# Patient Record
Sex: Female | Born: 1960 | Race: Black or African American | Hispanic: No | Marital: Married | Smoking: Never smoker
Health system: Southern US, Community
[De-identification: ages and names within clinical notes are randomized; demographics above are authoritative.]

## PROBLEM LIST (undated history)

## (undated) DIAGNOSIS — J309 Allergic rhinitis, unspecified: Secondary | ICD-10-CM

## (undated) DIAGNOSIS — I10 Essential (primary) hypertension: Secondary | ICD-10-CM

## (undated) DIAGNOSIS — R7302 Impaired glucose tolerance (oral): Secondary | ICD-10-CM

## (undated) DIAGNOSIS — E559 Vitamin D deficiency, unspecified: Secondary | ICD-10-CM

## (undated) HISTORY — DX: Vitamin D deficiency, unspecified: E55.9

## (undated) HISTORY — DX: Impaired glucose tolerance (oral): R73.02

## (undated) HISTORY — DX: Allergic rhinitis, unspecified: J30.9

## (undated) HISTORY — PX: TOE SURGERY: SHX1073

---

## 2004-04-15 ENCOUNTER — Emergency Department: Payer: Self-pay | Admitting: Emergency Medicine

## 2005-02-04 ENCOUNTER — Ambulatory Visit: Payer: Self-pay | Admitting: General Practice

## 2005-03-19 ENCOUNTER — Ambulatory Visit: Payer: Self-pay | Admitting: Surgery

## 2006-02-04 ENCOUNTER — Emergency Department: Payer: Self-pay | Admitting: Emergency Medicine

## 2006-09-06 ENCOUNTER — Ambulatory Visit: Payer: Self-pay

## 2010-10-11 ENCOUNTER — Emergency Department: Payer: Self-pay | Admitting: Emergency Medicine

## 2011-02-03 ENCOUNTER — Ambulatory Visit: Payer: Self-pay | Admitting: Internal Medicine

## 2012-02-15 ENCOUNTER — Ambulatory Visit: Payer: Self-pay | Admitting: Internal Medicine

## 2013-10-10 ENCOUNTER — Ambulatory Visit: Payer: Self-pay | Admitting: Internal Medicine

## 2013-12-11 ENCOUNTER — Ambulatory Visit: Payer: Self-pay | Admitting: Internal Medicine

## 2014-10-31 ENCOUNTER — Other Ambulatory Visit: Payer: Self-pay | Admitting: Internal Medicine

## 2014-10-31 DIAGNOSIS — Z1231 Encounter for screening mammogram for malignant neoplasm of breast: Secondary | ICD-10-CM

## 2014-12-16 ENCOUNTER — Ambulatory Visit: Payer: Self-pay | Attending: Internal Medicine

## 2015-07-28 ENCOUNTER — Other Ambulatory Visit: Payer: Self-pay | Admitting: Internal Medicine

## 2015-07-28 DIAGNOSIS — Z1231 Encounter for screening mammogram for malignant neoplasm of breast: Secondary | ICD-10-CM

## 2015-08-07 ENCOUNTER — Ambulatory Visit
Admission: RE | Admit: 2015-08-07 | Discharge: 2015-08-07 | Disposition: A | Discharge status: Home / Self Care | Payer: PRIVATE HEALTH INSURANCE | Source: Ambulatory Visit | Attending: Internal Medicine | Admitting: Internal Medicine

## 2015-08-07 DIAGNOSIS — Z1231 Encounter for screening mammogram for malignant neoplasm of breast: Secondary | ICD-10-CM | POA: Diagnosis present

## 2016-01-12 ENCOUNTER — Encounter: Payer: Self-pay | Admitting: Emergency Medicine

## 2016-01-12 DIAGNOSIS — I10 Essential (primary) hypertension: Secondary | ICD-10-CM | POA: Diagnosis not present

## 2016-01-12 DIAGNOSIS — S3992XA Unspecified injury of lower back, initial encounter: Secondary | ICD-10-CM | POA: Diagnosis present

## 2016-01-12 DIAGNOSIS — S39012A Strain of muscle, fascia and tendon of lower back, initial encounter: Secondary | ICD-10-CM | POA: Insufficient documentation

## 2016-01-12 DIAGNOSIS — Y92410 Unspecified street and highway as the place of occurrence of the external cause: Secondary | ICD-10-CM | POA: Diagnosis not present

## 2016-01-12 DIAGNOSIS — Y9389 Activity, other specified: Secondary | ICD-10-CM | POA: Diagnosis not present

## 2016-01-12 DIAGNOSIS — Y999 Unspecified external cause status: Secondary | ICD-10-CM | POA: Diagnosis not present
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
## 2016-01-13 ENCOUNTER — Emergency Department
Admission: EM | Admit: 2016-01-13 | Discharge: 2016-01-13 | Disposition: A | Discharge status: Home / Self Care | Payer: PRIVATE HEALTH INSURANCE | Attending: Emergency Medicine | Admitting: Emergency Medicine

## 2016-01-13 DIAGNOSIS — S39012A Strain of muscle, fascia and tendon of lower back, initial encounter: Secondary | ICD-10-CM

## 2016-01-13 HISTORY — DX: Essential (primary) hypertension: I10

## 2016-01-13 MED ORDER — DIAZEPAM 2 MG PO TABS
2.0000 mg | ORAL_TABLET | Freq: Once | ORAL | Status: AC
  Administered 2016-01-13: 2 mg via ORAL
  Filled 2016-01-13: qty 1

## 2016-01-13 MED ORDER — IBUPROFEN 600 MG PO TABS: 600.0000 mg | ORAL_TABLET | Freq: Three times a day (TID) | ORAL | 0 refills | Status: DC | PRN

## 2016-01-13 MED ORDER — IBUPROFEN 600 MG PO TABS
600.0000 mg | ORAL_TABLET | Freq: Once | ORAL | Status: AC
  Administered 2016-01-13: 600 mg via ORAL
  Filled 2016-01-13: qty 1

## 2016-01-13 MED ORDER — DIAZEPAM 2 MG PO TABS: 2.0000 mg | ORAL_TABLET | Freq: Three times a day (TID) | ORAL | 0 refills | Status: DC | PRN
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
## 2016-01-13 NOTE — Discharge Instructions (Signed)
1. You may take medicines as needed for pain and muscle spasms (Motrin/Valium #15). 2. Apply moist heat to affected area several times daily. 3. Return to the ER for worsening symptoms, persistent vomiting, difficulty breathing or other concerns.

## 2016-10-11 ENCOUNTER — Other Ambulatory Visit: Payer: Self-pay | Admitting: Internal Medicine

## 2016-10-11 DIAGNOSIS — Z1231 Encounter for screening mammogram for malignant neoplasm of breast: Secondary | ICD-10-CM

## 2016-11-23 ENCOUNTER — Ambulatory Visit
Admission: RE | Admit: 2016-11-23 | Discharge: 2016-11-23 | Disposition: A | Discharge status: Home / Self Care | Payer: PRIVATE HEALTH INSURANCE | Source: Ambulatory Visit | Attending: Internal Medicine | Admitting: Internal Medicine

## 2016-11-23 DIAGNOSIS — Z1231 Encounter for screening mammogram for malignant neoplasm of breast: Secondary | ICD-10-CM | POA: Insufficient documentation

## 2017-10-02 ENCOUNTER — Emergency Department: Payer: PRIVATE HEALTH INSURANCE

## 2017-10-02 ENCOUNTER — Encounter: Payer: Self-pay | Admitting: Emergency Medicine

## 2017-10-02 ENCOUNTER — Other Ambulatory Visit: Payer: Self-pay

## 2017-10-02 ENCOUNTER — Emergency Department
Admission: EM | Admit: 2017-10-02 | Discharge: 2017-10-02 | Disposition: A | Discharge status: Home / Self Care | Payer: PRIVATE HEALTH INSURANCE | Attending: Emergency Medicine | Admitting: Emergency Medicine

## 2017-10-02 DIAGNOSIS — J069 Acute upper respiratory infection, unspecified: Secondary | ICD-10-CM

## 2017-10-02 DIAGNOSIS — I10 Essential (primary) hypertension: Secondary | ICD-10-CM | POA: Insufficient documentation

## 2017-10-02 DIAGNOSIS — H9202 Otalgia, left ear: Secondary | ICD-10-CM | POA: Diagnosis present

## 2017-10-02 LAB — GROUP A STREP BY PCR: Group A Strep by PCR: NOT DETECTED

## 2017-10-02 MED ORDER — PREDNISONE 10 MG PO TABS: ORAL_TABLET | ORAL | 0 refills | Status: DC

## 2017-10-02 MED ORDER — LIDOCAINE VISCOUS 2 % MT SOLN: 10.0000 mL | OROMUCOSAL | 0 refills | Status: DC | PRN

## 2017-10-02 MED ORDER — AZITHROMYCIN 250 MG PO TABS: ORAL_TABLET | ORAL | 0 refills | Status: DC

## 2017-10-02 NOTE — ED Triage Notes (Signed)
Pt c/o cough since Wednesday that is non productive, L ear pain and sore throat with hoarse voice. Pt is alert and oriented at this time.

## 2017-10-02 NOTE — ED Provider Notes (Signed)
[facility] Emergency Department Provider Note  ____________________________________________  Time seen: Approximately 12:26 PM  I have reviewed the triage vital signs and the nursing notes.   HISTORY  Chief Complaint Cough; Sore Throat; and Otalgia    HPI Deborah Raymond is a 57 y.o. female that presents emergency department for evaluation of left ear pain, nasal congestion, sore throat, hoarseness, nonproductive cough for 5 days.  No sick contacts.  Patient is unsure of fever.  She does not smoke. No nausea, vomiting, abdominal pain, diarrhea, constipation.  Past Medical History:  Diagnosis Date  . Hypertension     There are no active problems to display for this patient.   Past Surgical History:  Procedure Laterality Date  . TOE SURGERY      Prior to Admission medications   Medication Sig Start Date End Date Taking? Authorizing Provider  azithromycin (ZITHROMAX Z-PAK) 250 MG tablet Take 2 tablets (500 mg) on  Day 1,  followed by 1 tablet (250 mg) once daily on Days 2 through 5. 10/02/17   Enid Derry, PA-C  diazepam (VALIUM) 2 MG tablet Take 1 tablet (2 mg total) by mouth every 8 (eight) hours as needed for muscle spasms. 01/13/16   Irean Hong, MD  ibuprofen (ADVIL,MOTRIN) 600 MG tablet Take 1 tablet (600 mg total) by mouth every 8 (eight) hours as needed. 01/13/16   Irean Hong, MD  lidocaine (XYLOCAINE) 2 % solution Use as directed 10 mLs in the mouth or throat as needed for mouth pain. 10/02/17   Enid Derry, PA-C  predniSONE (DELTASONE) 10 MG tablet Take 6 tablets on day 1, take 5 tablets on day 2, take 4 tablets on day 3, take 3 tablets on day 4, take 2 tablets on day 5, take 1 tablet on day 6 10/02/17   Enid Derry, PA-C    Allergies Hydrocodone  Family History  Problem Relation Age of Onset  . Breast cancer Cousin 7    Social History Social History   Tobacco Use  . Smoking status: Never Smoker  . Smokeless tobacco: Never  Used  Substance Use Topics  . Alcohol use: No  . Drug use: Not on file     Review of Systems  Constitutional: No chills Eyes: No visual changes. No discharge. ENT: Positive for congestion and rhinorrhea. Cardiovascular: No chest pain. Respiratory: Positive for cough. No SOB. Gastrointestinal: No abdominal pain.  No nausea, no vomiting.  No diarrhea.  No constipation. Musculoskeletal: Negative for musculoskeletal pain. Skin: Negative for rash, abrasions, lacerations, ecchymosis. Neurological: Negative for headaches.   ____________________________________________   PHYSICAL EXAM:  VITAL SIGNS: ED Triage Vitals [10/02/17 1159]  Enc Vitals Group     BP 131/78     Pulse Rate 91     Resp 18     Temp 99.5 F (37.5 C)     Temp Source Oral     SpO2 100 %     Weight 149 lb (67.6 kg)     Height  (1.676 m)     Head Circumference      Peak Flow      Pain Score 0     Pain Loc      Pain Edu?      Excl. in GC?      Constitutional: Alert and oriented. Well appearing and in no acute distress. Eyes: Conjunctivae are normal. PERRL. EOMI. No discharge. Head: Atraumatic. ENT: No frontal and maxillary sinus tenderness.  Ears: Tympanic membranes pearly gray with good landmarks. No discharge.      Nose: Mild congestion/rhinnorhea.      Mouth/Throat: Mucous membranes are moist. Oropharynx mildly erythematous. Tonsils not enlarged. No exudates. Uvula midline. Hoarseness, not hot potato  Neck: No stridor.   Hematological/Lymphatic/Immunilogical: No cervical lymphadenopathy. Cardiovascular: Normal rate, regular rhythm.  Good peripheral circulation. Respiratory: Normal respiratory effort without tachypnea or retractions. Lungs CTAB. Good air entry to the bases with no decreased or absent breath sounds. Gastrointestinal: Bowel sounds 4 quadrants. Soft and nontender to palpation. No guarding or rigidity. No palpable masses. No distention. Musculoskeletal: Full range of motion to  all extremities. No gross deformities appreciated. Neurologic:  Normal speech and language. No gross focal neurologic deficits are appreciated.  Skin:  Skin is warm, dry and intact. No rash noted. Psychiatric: Mood and affect are normal. Speech and behavior are normal. Patient exhibits appropriate insight and judgement.   ____________________________________________   LABS (all labs ordered are listed, but only abnormal results are displayed)  Labs Reviewed  GROUP A STREP BY PCR   ____________________________________________  EKG   ____________________________________________  RADIOLOGY Lexine Baton, personally viewed and evaluated these images (plain radiographs) as part of my medical decision making, as well as reviewing the written report by the radiologist.  Dg Chest 2 View  Result Date: 10/02/2017 CLINICAL DATA:  Cough since Wednesday that is nonproductive. EXAM: CHEST - 2 VIEW COMPARISON:  None. FINDINGS: Normal heart size and mediastinal contours. There is no edema, consolidation, effusion, or pneumothorax. Scoliosis. No acute osseous finding. IMPRESSION: No evidence of active disease. Electronically Signed   By: Marnee Spring M.D.   On: 10/02/2017 13:28    ____________________________________________    PROCEDURES  Procedure(s) performed:    Procedures    Medications - No data to display   ____________________________________________   INITIAL IMPRESSION / ASSESSMENT AND PLAN / ED COURSE  Pertinent labs & imaging results that were available during my care of the patient were reviewed by me and considered in my medical decision making (see chart for details).  Review of the [place] CSRS was performed in accordance of the [facility] prior to dispensing any controlled drugs.     Patient's diagnosis is consistent with URI with cough. Vital signs and exam are reassuring. Strep negative. No infiltrate on chest xray. Patient appears well and is staying well  hydrated. Patient should alternate tylenol and ibuprofen for fever. Patient feels comfortable going home. Patient will be discharged home with prescriptions for azithromycin, prednisone, viscous lidocaine. Patient is to follow up with PCP as needed or otherwise directed. Patient is given ED precautions to return to the ED for any worsening or new symptoms.     ____________________________________________  FINAL CLINICAL IMPRESSION(S) / ED DIAGNOSES  Final diagnoses:  URI with cough and congestion      NEW MEDICATIONS STARTED DURING THIS VISIT:  ED Discharge Orders        Ordered    azithromycin (ZITHROMAX Z-PAK) 250 MG tablet     10/02/17 1339    predniSONE (DELTASONE) 10 MG tablet     10/02/17 1339    lidocaine (XYLOCAINE) 2 % solution  As needed     10/02/17 1339          This chart was dictated using voice recognition software/Dragon. Despite best efforts to proofread, errors can occur which can change the meaning. Any change was purely unintentional.    Enid Derry, PA-C 10/02/17 1535    Cyril Loosen, Molly Maduro,  MD 10/03/17 4098

## 2017-11-23 ENCOUNTER — Other Ambulatory Visit: Payer: Self-pay | Admitting: Internal Medicine

## 2017-11-23 DIAGNOSIS — Z1231 Encounter for screening mammogram for malignant neoplasm of breast: Secondary | ICD-10-CM

## 2017-12-06 ENCOUNTER — Ambulatory Visit
Admission: RE | Admit: 2017-12-06 | Discharge: 2017-12-06 | Disposition: A | Discharge status: Home / Self Care | Payer: PRIVATE HEALTH INSURANCE | Source: Ambulatory Visit | Attending: Internal Medicine | Admitting: Internal Medicine

## 2017-12-06 DIAGNOSIS — Z1231 Encounter for screening mammogram for malignant neoplasm of breast: Secondary | ICD-10-CM | POA: Diagnosis not present

## 2019-09-21 ENCOUNTER — Other Ambulatory Visit: Payer: Self-pay | Admitting: Internal Medicine

## 2019-09-21 DIAGNOSIS — Z1231 Encounter for screening mammogram for malignant neoplasm of breast: Secondary | ICD-10-CM

## 2019-10-02 ENCOUNTER — Ambulatory Visit
Admission: RE | Admit: 2019-10-02 | Discharge: 2019-10-02 | Disposition: A | Discharge status: Home / Self Care | Payer: PRIVATE HEALTH INSURANCE | Source: Ambulatory Visit | Attending: Internal Medicine | Admitting: Internal Medicine

## 2019-10-02 DIAGNOSIS — Z1231 Encounter for screening mammogram for malignant neoplasm of breast: Secondary | ICD-10-CM

## 2019-10-27 ENCOUNTER — Other Ambulatory Visit: Payer: Self-pay

## 2019-10-27 ENCOUNTER — Ambulatory Visit: Payer: PRIVATE HEALTH INSURANCE | Attending: Internal Medicine

## 2019-10-27 DIAGNOSIS — Z23 Encounter for immunization: Secondary | ICD-10-CM

## 2019-10-27 NOTE — Progress Notes (Signed)
   Covid-19 Vaccination Clinic  Name:  Deborah Raymond    MRN: [id number] DOB: [birth year]  10/27/2019  [name] was observed post Covid-19 immunization for 15 minutes without incident. She was provided with Vaccine Information Sheet and instruction to access the V-Safe system.   [name] was instructed to call 911 with any severe reactions post vaccine: Marland Kitchen Difficulty breathing  . Swelling of face and throat  . A fast heartbeat  . A bad rash all over body  . Dizziness and weakness   Immunizations Administered    Name Date Dose VIS Date Route   Pfizer COVID-19 Vaccine 10/27/2019  8:12 AM 0.3 mL 08/01/2018 Intramuscular   Manufacturer: [employer], [employer]   Lot: [id number]   NDC: [address]-4

## 2019-11-17 ENCOUNTER — Ambulatory Visit: Payer: PRIVATE HEALTH INSURANCE | Attending: Internal Medicine

## 2020-12-11 ENCOUNTER — Other Ambulatory Visit: Payer: Self-pay | Admitting: Internal Medicine

## 2020-12-11 DIAGNOSIS — Z1231 Encounter for screening mammogram for malignant neoplasm of breast: Secondary | ICD-10-CM

## 2020-12-31 ENCOUNTER — Other Ambulatory Visit: Payer: Self-pay

## 2020-12-31 ENCOUNTER — Ambulatory Visit
Admission: RE | Admit: 2020-12-31 | Discharge: 2020-12-31 | Disposition: A | Discharge status: Home / Self Care | Payer: PRIVATE HEALTH INSURANCE | Source: Ambulatory Visit | Attending: Internal Medicine | Admitting: Internal Medicine

## 2020-12-31 DIAGNOSIS — Z1231 Encounter for screening mammogram for malignant neoplasm of breast: Secondary | ICD-10-CM

## 2021-12-10 ENCOUNTER — Other Ambulatory Visit: Payer: Self-pay | Admitting: Internal Medicine

## 2021-12-10 DIAGNOSIS — Z1231 Encounter for screening mammogram for malignant neoplasm of breast: Secondary | ICD-10-CM

## 2021-12-21 IMAGING — MG MM DIGITAL SCREENING BILAT W/ TOMO AND CAD
8 series · 8 of 24 positions shown · non-contrast
Comparison: Previous exam(s).

CLINICAL DATA: Screening.

EXAM:
DIGITAL SCREENING BILATERAL MAMMOGRAM WITH TOMOSYNTHESIS AND CAD
TECHNIQUE: Bilateral screening digital craniocaudal and mediolateral oblique
mammograms were obtained. Bilateral screening digital breast
tomosynthesis was performed. The images were evaluated with
computer-aided detection.

[L MLO synth-2D]
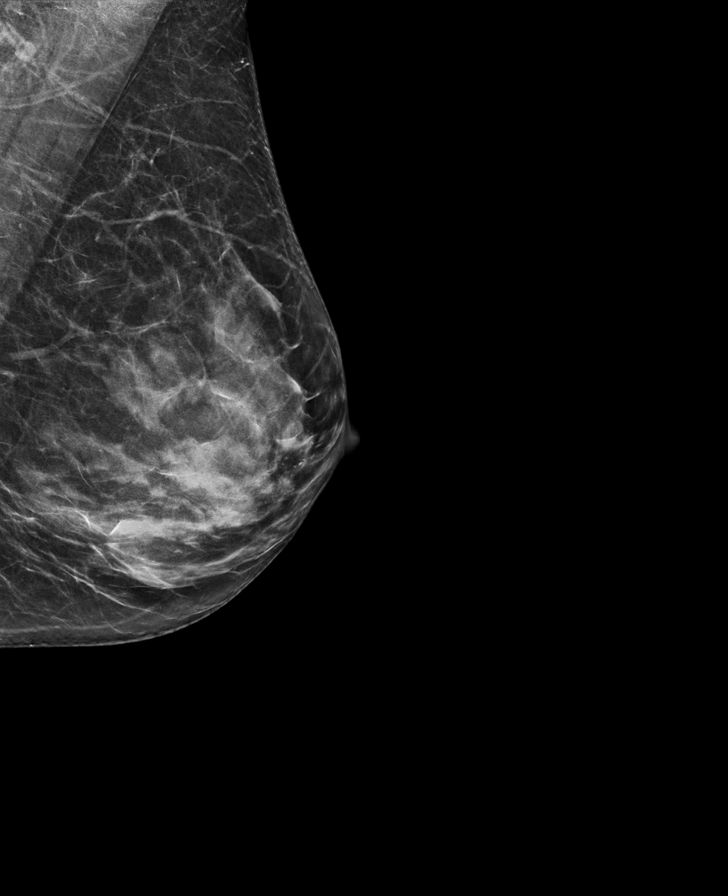

[L CC synth-2D]
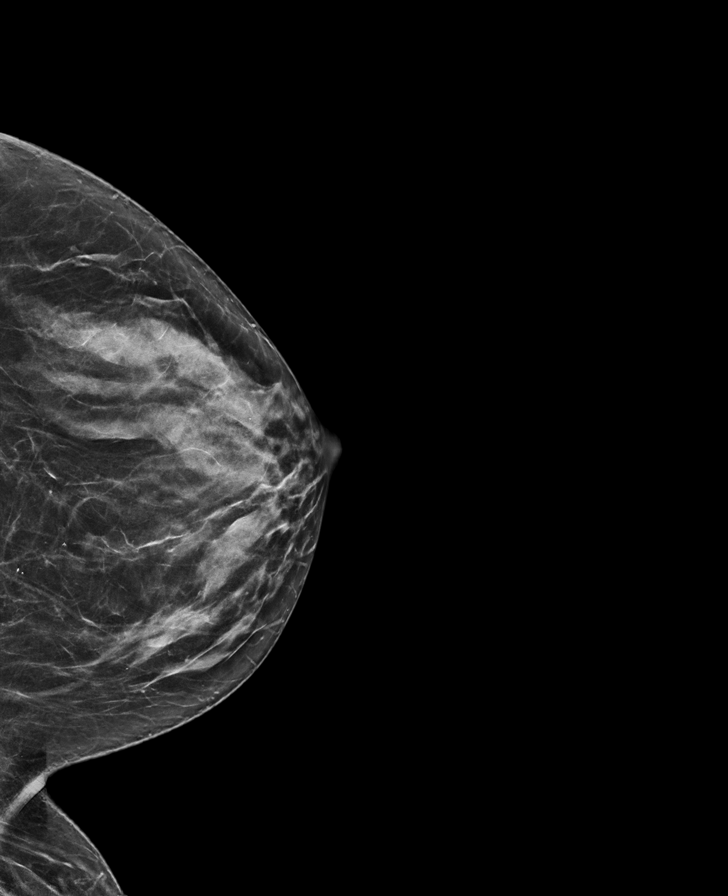

[R CC synth-2D]
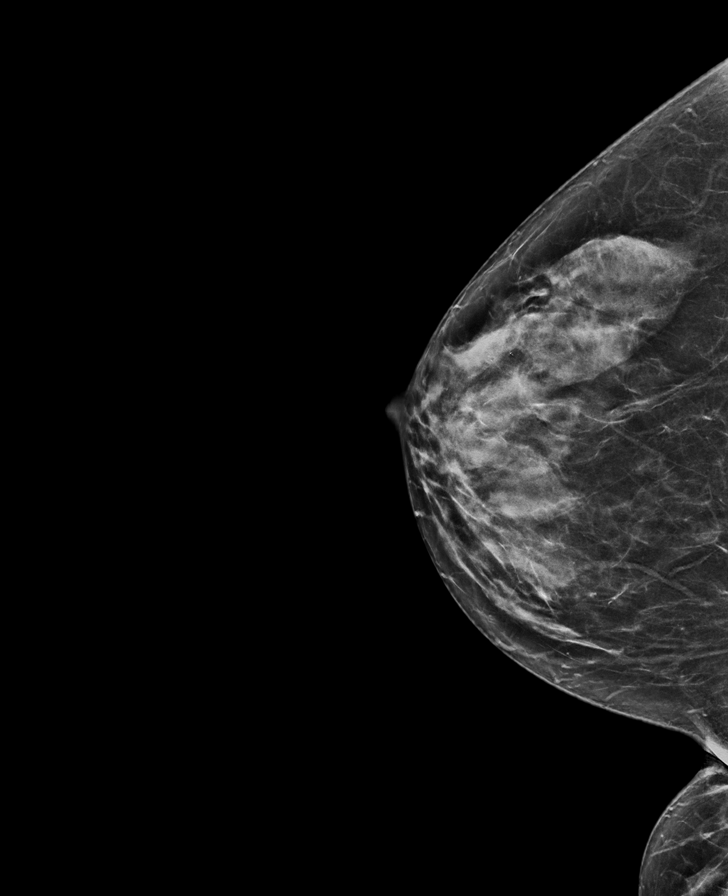

[R MLO synth-2D]
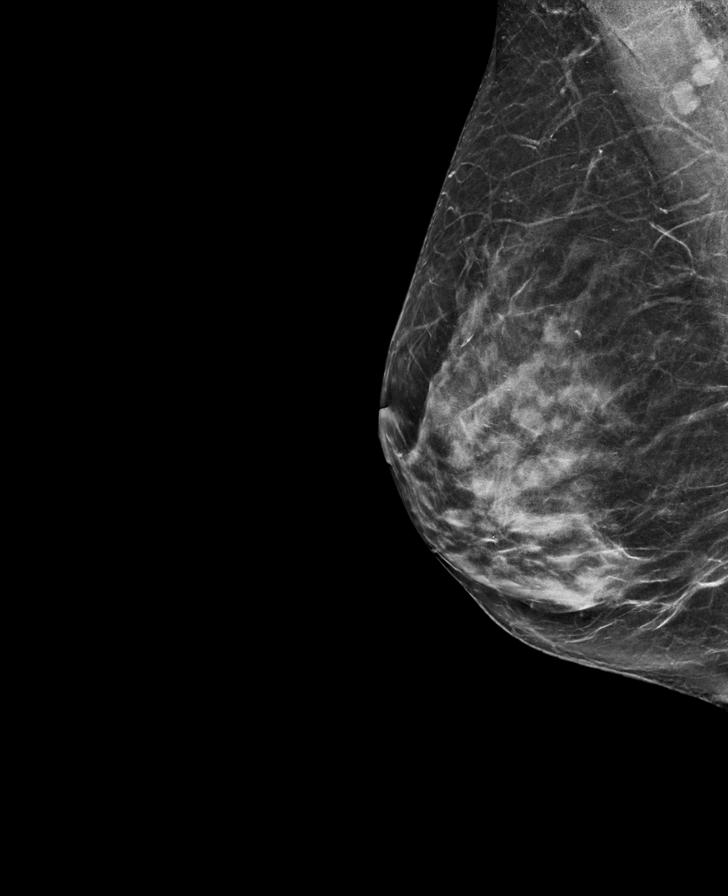

[L CC tomo · tomo slice 33/65.0]
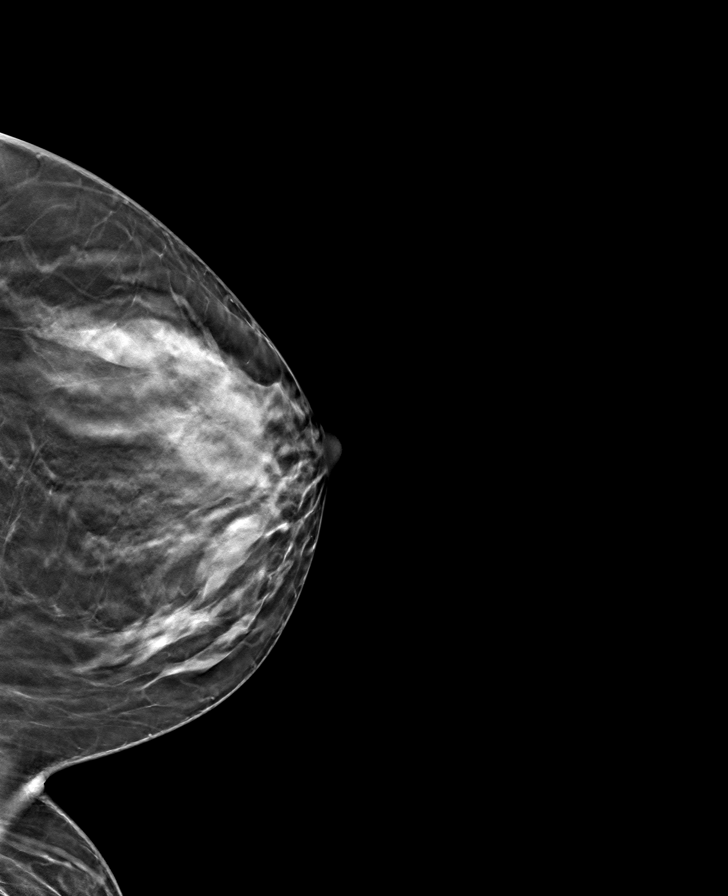

[R MLO tomo · tomo slice 35/69.0]
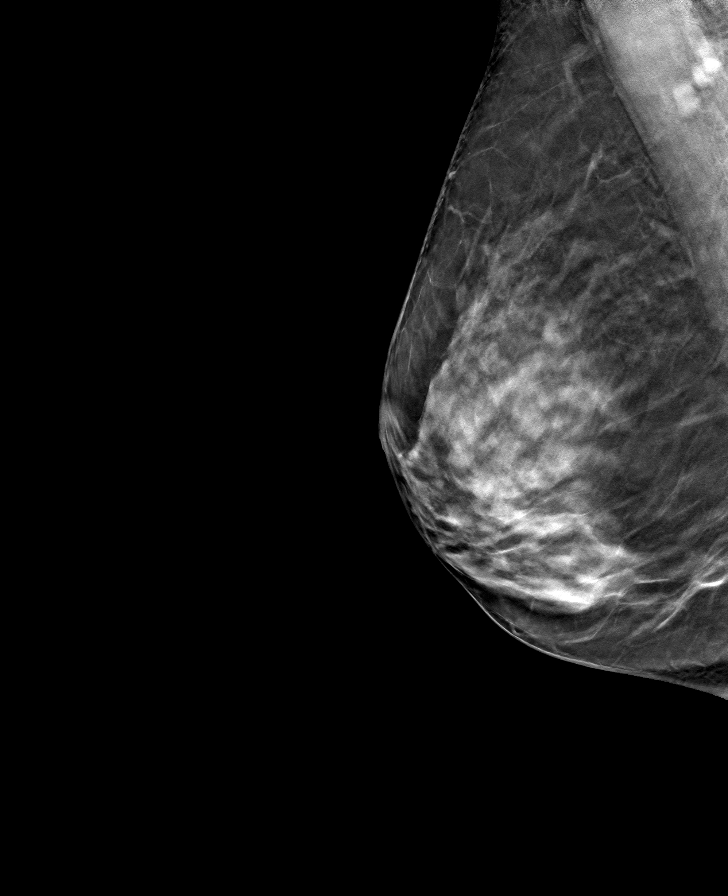

[R CC tomo · tomo slice 35/68.0]
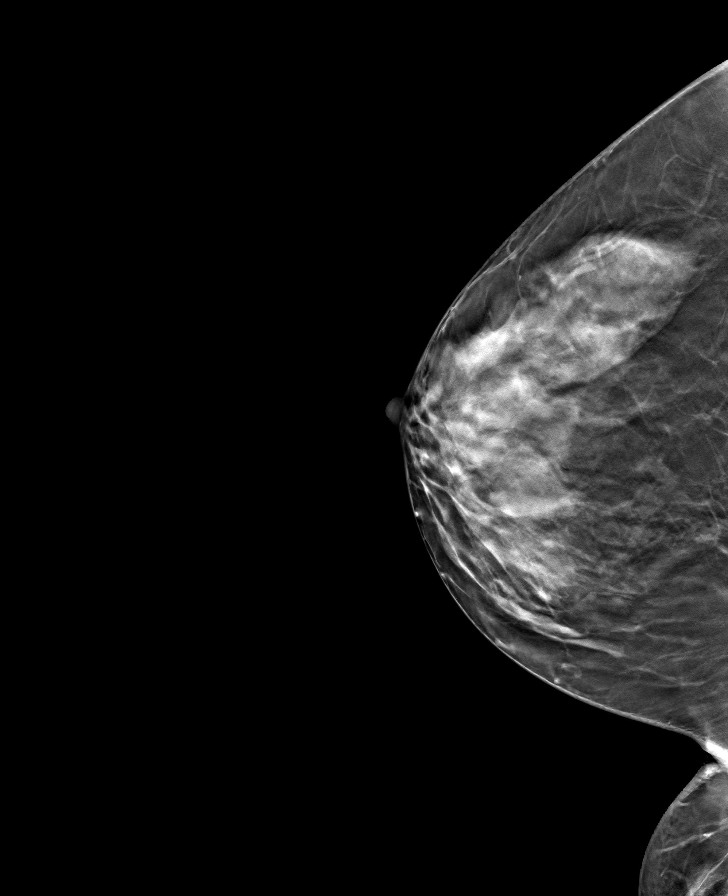

[L MLO tomo · tomo slice 33/65.0]
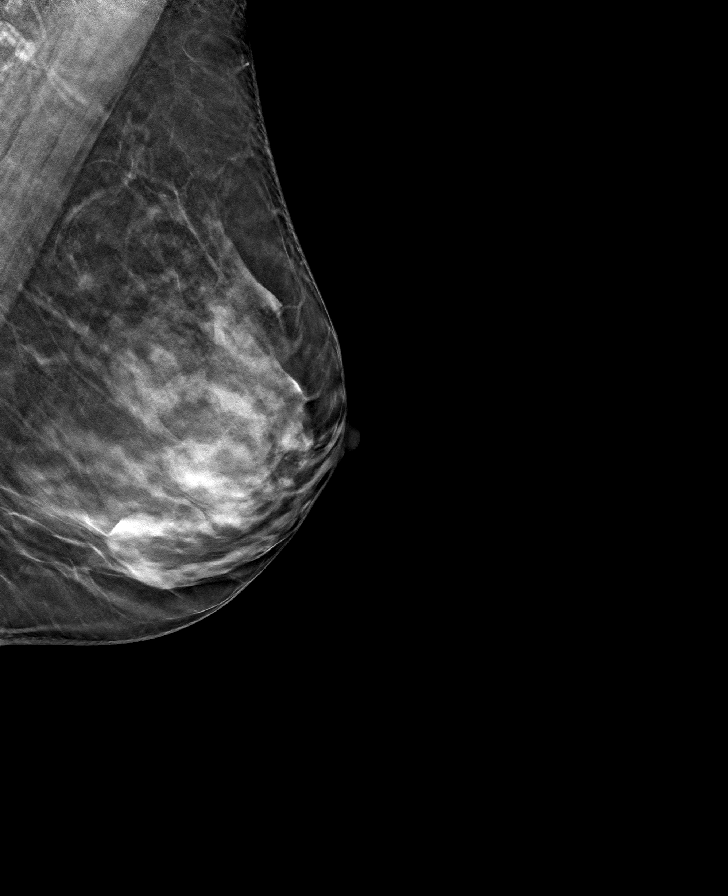

[8 of 24 positions shown; findings below may reference images not displayed]

ACR Breast Density Category c: The breast tissue is heterogeneously
dense, which may obscure small masses.
FINDINGS: There are no findings suspicious for malignancy.
IMPRESSION: No mammographic evidence of malignancy. A result letter of this
screening mammogram will be mailed directly to the patient.

RECOMMENDATION:
Screening mammogram in one year. (Code:Q3-W-BC3)

BI-RADS CATEGORY  1: Negative.

## 2022-01-01 ENCOUNTER — Ambulatory Visit
Admission: RE | Admit: 2022-01-01 | Discharge: 2022-01-01 | Disposition: A | Discharge status: Home / Self Care | Payer: PRIVATE HEALTH INSURANCE | Source: Ambulatory Visit | Attending: Internal Medicine | Admitting: Internal Medicine

## 2022-01-01 DIAGNOSIS — Z1231 Encounter for screening mammogram for malignant neoplasm of breast: Secondary | ICD-10-CM | POA: Diagnosis present

## 2022-05-30 ENCOUNTER — Encounter: Payer: Self-pay | Admitting: Emergency Medicine

## 2022-05-30 ENCOUNTER — Emergency Department
Admission: EM | Admit: 2022-05-30 | Discharge: 2022-05-30 | Disposition: A | Discharge status: Home / Self Care | Payer: PRIVATE HEALTH INSURANCE | Attending: Emergency Medicine | Admitting: Emergency Medicine

## 2022-05-30 ENCOUNTER — Other Ambulatory Visit: Payer: Self-pay

## 2022-05-30 DIAGNOSIS — Z1152 Encounter for screening for COVID-19: Secondary | ICD-10-CM | POA: Diagnosis not present

## 2022-05-30 DIAGNOSIS — J111 Influenza due to unidentified influenza virus with other respiratory manifestations: Secondary | ICD-10-CM

## 2022-05-30 DIAGNOSIS — R509 Fever, unspecified: Secondary | ICD-10-CM | POA: Diagnosis not present

## 2022-05-30 LAB — RESP PANEL BY RT-PCR (RSV, FLU A&B, COVID)  RVPGX2
Influenza A by PCR: NEGATIVE
Influenza B by PCR: NEGATIVE
Resp Syncytial Virus by PCR: NEGATIVE
SARS Coronavirus 2 by RT PCR: NEGATIVE

## 2022-05-30 MED ORDER — OSELTAMIVIR PHOSPHATE 75 MG PO CAPS: 75.0000 mg | ORAL_CAPSULE | Freq: Two times a day (BID) | ORAL | 0 refills | Status: AC

## 2022-05-30 MED ORDER — BENZONATATE 100 MG PO CAPS: 100.0000 mg | ORAL_CAPSULE | Freq: Three times a day (TID) | ORAL | 0 refills | Status: DC | PRN

## 2022-05-30 NOTE — ED Triage Notes (Signed)
Pt vis POV c/o fever, chills, dry non-productive cough ongoing for 1 day. No known sick exposure. Pt has take OTC tylenol around 5 pm today. Reports fevers 102 F. Denies NVD. No CP/SOB.

## 2022-05-30 NOTE — ED Provider Notes (Signed)
[facility] Provider Note    Event Date/Time   First MD Initiated Contact with Patient 05/30/22 2052     (approximate)   History   Fever   HPI  Deborah Raymond is a 61 y.o. female presents emergency department with fever, chills, cough for 1 day.  Taking over-the-counter Tylenol for fever.  States was 102 earlier.  No chest pain or shortness of breath.  No vomiting or diarrhea      Physical Exam   Triage Vital Signs: ED Triage Vitals [05/30/22 1928]  Enc Vitals Group     BP 126/61     Pulse Rate 99     Resp 15     Temp 98.2 F (36.8 C)     Temp Source Oral     SpO2 100 %     Weight 160 lb (72.6 kg)     Height 5\' 6"  (1.676 m)     Head Circumference      Peak Flow      Pain Score 0     Pain Loc      Pain Edu?      Excl. in GC?     Most recent vital signs: Vitals:   05/30/22 1928  BP: 126/61  Pulse: 99  Resp: 15  Temp: 98.2 F (36.8 C)  SpO2: 100%     General: Awake, no distress.   CV:  Good peripheral perfusion. regular rate and  rhythm Resp:  Normal effort. Lungs cta Abd:  No distention.   Other:      ED Results / Procedures / Treatments   Labs (all labs ordered are listed, but only abnormal results are displayed) Labs Reviewed  RESP PANEL BY RT-PCR (RSV, FLU A&B, COVID)  [id number]     EKG     RADIOLOGY     PROCEDURES:   Procedures   MEDICATIONS ORDERED IN ED: Medications - No data to display   IMPRESSION / MDM / ASSESSMENT AND PLAN / ED COURSE  I reviewed the triage vital signs and the nursing notes.                              Differential diagnosis includes, but is not limited to, COVID, influenza, RSV, CAP  Patient's presentation is most consistent with acute complicated illness / injury requiring diagnostic workup.   The patient appears to be very well, vitals are normal, I do not feel she is warranting further workup as her respiratory panel is negative.  Did explain to her that a lot  of times flu will not show up on swab until 1 to 2 days of illness.  Will go ahead and start her on Tamiflu as she has flulike symptoms.  She is in agreement with this treatment plan.  She was also given Tessalon Perles for her cough.  Discharged in stable condition.      FINAL CLINICAL IMPRESSION(S) / ED DIAGNOSES   Final diagnoses:  Influenza-like illness     Rx / DC Orders   ED Discharge Orders          Ordered    oseltamivir (TAMIFLU) 75 MG capsule  2 times daily        05/30/22 2053    benzonatate (TESSALON PERLES) 100 MG capsule  3 times daily PRN        05/30/22 2053  Note:  This document was prepared using Dragon voice recognition software and may include unintentional dictation errors.    Faythe Ghee, PA-C 05/30/22 2145    Minna Antis, MD 05/30/22 2203

## 2022-09-02 ENCOUNTER — Ambulatory Visit: Payer: PRIVATE HEALTH INSURANCE | Admitting: Internal Medicine

## 2022-10-14 ENCOUNTER — Ambulatory Visit: Payer: PRIVATE HEALTH INSURANCE | Admitting: Internal Medicine

## 2022-10-14 ENCOUNTER — Encounter: Payer: Self-pay | Admitting: Internal Medicine

## 2022-10-14 VITALS — BP 120/72 | HR 75 | Ht 62.0 in | Wt 163.8 lb

## 2022-10-14 DIAGNOSIS — R7302 Impaired glucose tolerance (oral): Secondary | ICD-10-CM

## 2022-10-14 DIAGNOSIS — I10 Essential (primary) hypertension: Secondary | ICD-10-CM | POA: Diagnosis not present

## 2022-10-14 DIAGNOSIS — E559 Vitamin D deficiency, unspecified: Secondary | ICD-10-CM

## 2022-10-14 DIAGNOSIS — J060 Acute laryngopharyngitis: Secondary | ICD-10-CM

## 2022-10-14 DIAGNOSIS — J301 Allergic rhinitis due to pollen: Secondary | ICD-10-CM

## 2022-10-14 MED ORDER — DOXYCYCLINE HYCLATE 100 MG PO TABS: 100.0000 mg | ORAL_TABLET | Freq: Two times a day (BID) | ORAL | 0 refills | Status: DC

## 2022-10-14 MED ORDER — FLUTICASONE PROPIONATE 50 MCG/ACT NA SUSP: 1.0000 | Freq: Every day | NASAL | 2 refills | Status: DC

## 2022-10-14 NOTE — Progress Notes (Signed)
Established Patient Office Visit  Subjective:  Patient ID: Deborah Raymond, female    DOB: 14-Apr-1961  Age: 62 y.o. MRN: [id number]  Chief Complaint  Patient presents with   Follow-up    Ear ache/sore throat    Patient comes in for an acute visit today with complaints of right ear pain and some sore throat.  She says she started 3 days ago thinking it was her nasal allergies which she usually gets seasonally.  Now she has some congestion of her head, postnasal drip, mild cough and mostly it is the right ear pain and pressure.  She does not have any fevers or chills, no nausea or vomiting, no dizziness.  She has not been around anybody who has been feeling unwell.  No body aches.  There is no diarrhea or constipation. On exam her throat is only mildly irritated, but her right tympanic membrane is opaque with some redness in the center.    No other concerns at this time.   Past Medical History:  Diagnosis Date   Allergic rhinitis    Hypertension    Impaired glucose tolerance    Vitamin D deficiency     Past Surgical History:  Procedure Laterality Date   TOE SURGERY      Social History   Socioeconomic History   Marital status: Married    Spouse name: Not on file   Number of children: Not on file   Years of education: Not on file   Highest education level: Not on file  Occupational History   Not on file  Tobacco Use   Smoking status: Never   Smokeless tobacco: Never  Vaping Use   Vaping Use: Never used  Substance and Sexual Activity   Alcohol use: No   Drug use: Not on file   Sexual activity: Not on file  Other Topics Concern   Not on file  Social History Narrative   Not on file   Social Determinants of Health   Financial Resource Strain: Not on file  Food Insecurity: Not on file  Transportation Needs: Not on file  Physical Activity: Not on file  Stress: Not on file  Social Connections: Not on file  Intimate Partner Violence: Not on file    Family  History  Problem Relation Age of Onset   Breast cancer Cousin 37       maternal    Allergies  Allergen Reactions   Hydrocodone    Penicillins Hives    Review of Systems  Constitutional:  Positive for malaise/fatigue. Negative for chills, diaphoresis, fever and weight loss.  HENT:  Positive for congestion and ear pain. Negative for ear discharge, sinus pain, sore throat and tinnitus.   Eyes:  Negative for blurred vision, double vision, photophobia, pain, discharge and redness.  Respiratory:  Positive for cough. Negative for shortness of breath, wheezing and stridor.   Cardiovascular:  Negative for chest pain, palpitations, leg swelling and PND.  Gastrointestinal:  Negative for abdominal pain, blood in stool, constipation, diarrhea, heartburn, melena, nausea and vomiting.  Genitourinary:  Negative for dysuria, frequency, hematuria and urgency.  Musculoskeletal:  Negative for back pain, falls, joint pain and myalgias.  Neurological:  Negative for dizziness, tingling, sensory change, weakness and headaches.  Psychiatric/Behavioral:  Negative for depression. The patient is not nervous/anxious and does not have insomnia.        Objective:   BP 120/72   Pulse 75   Ht 5\' 2"  (1.575 m)   Wt 163  lb 12.8 oz (74.3 kg)   SpO2 97%   BMI 29.96 kg/m   Vitals:   10/14/22 0930  BP: 120/72  Pulse: 75  Height: 5\' 2"  (1.575 m)  Weight: 163 lb 12.8 oz (74.3 kg)  SpO2: 97%  BMI (Calculated): 29.95    Physical Exam Vitals and nursing note reviewed.  Constitutional:      Appearance: Normal appearance.  HENT:     Head: Normocephalic.  Cardiovascular:     Rate and Rhythm: Normal rate and regular rhythm.     Pulses: Normal pulses.     Heart sounds: Normal heart sounds.  Pulmonary:     Effort: Pulmonary effort is normal.     Breath sounds: Normal breath sounds.  Abdominal:     General: Abdomen is flat.     Palpations: Abdomen is soft. There is no mass.     Tenderness: There is no  guarding.  Musculoskeletal:        General: Normal range of motion.     Cervical back: Normal range of motion and neck supple.  Skin:    General: Skin is warm.  Neurological:     General: No focal deficit present.     Mental Status: She is alert and oriented to person, place, and time.  Psychiatric:        Mood and Affect: Mood normal.        Behavior: Behavior normal.      No results found for any visits on 10/14/22.  No results found for this or any previous visit (from the past 2160 hour(s)).    Assessment & Plan:  Send in prescription for p.o. doxycycline.  Patient will use nasal spray and antihistamine Zyrtec.  Rest fluids and Tylenol as needed Problem List Items Addressed This Visit     Acute laryngopharyngitis - Primary   Relevant Medications   doxycycline (VIBRA-TABS) 100 MG tablet   Essential hypertension, benign   Relevant Medications   amLODipine-olmesartan (AZOR) 5-20 MG tablet   Impaired glucose tolerance   Vitamin D deficiency   Seasonal allergic rhinitis due to pollen    Return in about 1 week (around 10/21/2022).   Total time spent: 25 minutes  Margaretann Loveless, MD  10/14/2022

## 2022-10-22 ENCOUNTER — Ambulatory Visit: Payer: PRIVATE HEALTH INSURANCE | Admitting: Internal Medicine

## 2022-11-05 ENCOUNTER — Other Ambulatory Visit: Payer: Self-pay | Admitting: Internal Medicine

## 2022-11-05 DIAGNOSIS — J301 Allergic rhinitis due to pollen: Secondary | ICD-10-CM

## 2023-02-22 ENCOUNTER — Other Ambulatory Visit: Payer: Self-pay | Admitting: Internal Medicine

## 2023-02-22 DIAGNOSIS — Z1231 Encounter for screening mammogram for malignant neoplasm of breast: Secondary | ICD-10-CM

## 2023-03-08 ENCOUNTER — Ambulatory Visit
Admission: RE | Admit: 2023-03-08 | Discharge: 2023-03-08 | Disposition: A | Discharge status: Home / Self Care | Payer: PRIVATE HEALTH INSURANCE | Source: Ambulatory Visit | Attending: Internal Medicine | Admitting: Internal Medicine

## 2023-03-08 DIAGNOSIS — Z1231 Encounter for screening mammogram for malignant neoplasm of breast: Secondary | ICD-10-CM | POA: Diagnosis present

## 2023-05-13 ENCOUNTER — Other Ambulatory Visit: Payer: Self-pay | Admitting: Internal Medicine

## 2023-05-13 DIAGNOSIS — I10 Essential (primary) hypertension: Secondary | ICD-10-CM

## 2023-08-12 ENCOUNTER — Ambulatory Visit: Payer: No Typology Code available for payment source | Admitting: Internal Medicine

## 2023-08-12 ENCOUNTER — Encounter: Payer: Self-pay | Admitting: Internal Medicine

## 2023-08-12 VITALS — BP 112/72 | HR 88 | Ht 62.0 in | Wt 167.2 lb

## 2023-08-12 DIAGNOSIS — E559 Vitamin D deficiency, unspecified: Secondary | ICD-10-CM

## 2023-08-12 DIAGNOSIS — R5383 Other fatigue: Secondary | ICD-10-CM | POA: Diagnosis not present

## 2023-08-12 DIAGNOSIS — R7303 Prediabetes: Secondary | ICD-10-CM | POA: Diagnosis not present

## 2023-08-12 DIAGNOSIS — I10 Essential (primary) hypertension: Secondary | ICD-10-CM

## 2023-08-12 DIAGNOSIS — M79604 Pain in right leg: Secondary | ICD-10-CM

## 2023-08-12 DIAGNOSIS — R7302 Impaired glucose tolerance (oral): Secondary | ICD-10-CM

## 2023-08-12 MED ORDER — GABAPENTIN 100 MG PO CAPS: 100.0000 mg | ORAL_CAPSULE | Freq: Every day | ORAL | 2 refills | Status: DC

## 2023-08-12 NOTE — Progress Notes (Signed)
 Established Patient Office Visit  Subjective:  Patient ID: Deborah Raymond, female    DOB: 1961/03/27  Age: 63 y.o. MRN: [id number]  Chief Complaint  Patient presents with   Leg Pain    Constant right leg pain    Patient not in since May 2024.  Today comes in with complaints of right leg pain which started 4 months ago.  Patient reports is not in excruciating pain but a 4 on a scale of 1-10.  She takes a Tylenol and the discomfort goes away.  She cannot identify if it is her hip or knee or right ankle but somehow the whole leg hurts.  Now it has shifted mostly to the right lower leg along with tenderness of her right calf.  But there is no swelling or skin changes.  She has to stand at work on a concrete floor.  Recently changed her shoes with good arch support but the pain continues.  She describes an element of of restless leg, but sleeps well at night.  There is no back pain. Will check venous Doppler of the right lower leg to exclude possibility of DVT or phlebitis. Check labs. Start gabapentin at bedtime.  By  No other concerns at this time.   Past Medical History:  Diagnosis Date   Allergic rhinitis    Hypertension    Impaired glucose tolerance    Vitamin D deficiency     Past Surgical History:  Procedure Laterality Date   TOE SURGERY      Social History   Socioeconomic History   Marital status: Married    Spouse name: Not on file   Number of children: Not on file   Years of education: Not on file   Highest education level: Not on file  Occupational History   Not on file  Tobacco Use   Smoking status: Never   Smokeless tobacco: Never  Vaping Use   Vaping status: Never Used  Substance and Sexual Activity   Alcohol use: No   Drug use: Not on file   Sexual activity: Not on file  Other Topics Concern   Not on file  Social History Narrative   Not on file   Social Drivers of Health   Financial Resource Strain: Not on file  Food Insecurity: Not on file   Transportation Needs: Not on file  Physical Activity: Not on file  Stress: Not on file  Social Connections: Unknown (10/20/2021)   Received from [facility]   Social Network    Social Network: Not on file  Intimate Partner Violence: Unknown (09/10/2021)   Received from [facility]   HITS    Physically Hurt: Not on file    Insult or Talk Down To: Not on file    Threaten Physical Harm: Not on file    Scream or Curse: Not on file    Family History  Problem Relation Age of Onset   Breast cancer Cousin 47       maternal    Allergies  Allergen Reactions   Hydrocodone    Penicillins Hives    Outpatient Medications Prior to Visit  Medication Sig   amLODipine-olmesartan (AZOR) 5-20 MG tablet TAKE 1 TABLET BY MOUTH EVERY DAY   fluticasone (FLONASE) 50 MCG/ACT nasal spray SPRAY 1 SPRAY INTO BOTH NOSTRILS DAILY.   Vitamin D, Ergocalciferol, (DRISDOL) 1.25 MG (50000 UNIT) CAPS capsule Take 50,000 Units by mouth once a week.   doxycycline (VIBRA-TABS) 100 MG  tablet Take 1 tablet (100 mg total) by mouth 2 (two) times daily. (Patient not taking: Reported on 08/12/2023)   No facility-administered medications prior to visit.    Review of Systems  Constitutional:  Positive for malaise/fatigue. Negative for chills, fever and weight loss.  HENT: Negative.  Negative for sore throat.   Eyes: Negative.   Respiratory: Negative.  Negative for cough and shortness of breath.   Cardiovascular: Negative.  Negative for chest pain, palpitations and leg swelling.  Gastrointestinal: Negative.  Negative for abdominal pain, constipation, diarrhea, heartburn, nausea and vomiting.  Genitourinary: Negative.  Negative for dysuria and flank pain.  Musculoskeletal:  Positive for joint pain and myalgias.  Skin: Negative.   Neurological: Negative.  Negative for dizziness, tingling, tremors, sensory change, speech change and headaches.  Endo/Heme/Allergies: Negative.    Psychiatric/Behavioral: Negative.  Negative for depression and suicidal ideas. The patient is not nervous/anxious.        Objective:   BP 112/72   Pulse 88   Ht 5\' 2"  (1.575 m)   Wt 167 lb 3.2 oz (75.8 kg)   SpO2 97%   BMI 30.58 kg/m   Vitals:   08/12/23 1524  BP: 112/72  Pulse: 88  Height: 5\' 2"  (1.575 m)  Weight: 167 lb 3.2 oz (75.8 kg)  SpO2: 97%  BMI (Calculated): 30.57    Physical Exam Vitals and nursing note reviewed.  Constitutional:      Appearance: Normal appearance.  HENT:     Head: Normocephalic and atraumatic.     Nose: Nose normal.     Mouth/Throat:     Mouth: Mucous membranes are moist.     Pharynx: Oropharynx is clear.  Eyes:     Conjunctiva/sclera: Conjunctivae normal.     Pupils: Pupils are equal, round, and reactive to light.  Cardiovascular:     Rate and Rhythm: Normal rate and regular rhythm.     Pulses: Normal pulses.     Heart sounds: Normal heart sounds. No murmur heard. Pulmonary:     Effort: Pulmonary effort is normal.     Breath sounds: Normal breath sounds. No wheezing.  Abdominal:     General: Bowel sounds are normal.     Palpations: Abdomen is soft.     Tenderness: There is no abdominal tenderness. There is no right CVA tenderness or left CVA tenderness.  Musculoskeletal:        General: Normal range of motion.     Cervical back: Normal range of motion.     Right lower leg: No edema.     Left lower leg: No edema.  Skin:    General: Skin is warm and dry.  Neurological:     General: No focal deficit present.     Mental Status: She is alert and oriented to person, place, and time.  Psychiatric:        Mood and Affect: Mood normal.        Behavior: Behavior normal.      No results found for any visits on 08/12/23.  No results found for this or any previous visit (from the past 2160 hours).    Assessment & Plan:  Check labs, venous Dopplers. Start gabapentin at bedtime. Problem List Items Addressed This Visit      Essential hypertension, benign   Relevant Orders   CMP14+EGFR   CBC with Diff   Impaired glucose tolerance   Vitamin D deficiency   Relevant Orders   Vitamin D (25 hydroxy)   Other  Visit Diagnoses       Pain of right lower extremity    -  Primary   Relevant Medications   gabapentin (NEURONTIN) 100 MG capsule   Other Relevant Orders   Arthritis Panel   VAS [place] LOWER EXTREMITY VENOUS (DVT)     Other fatigue       Relevant Orders   Ferritin       Return in about 10 days (around 08/22/2023).   Total time spent: 30 minutes  Margaretann Loveless, MD  08/12/2023   This document may have been prepared by [facility] Voice Recognition software and as such may include unintentional dictation errors.

## 2023-08-13 LAB — CMP14+EGFR
ALT: 7 IU/L (ref 0–32)
AST: 15 IU/L (ref 0–40)
Albumin: 4.3 g/dL (ref 3.9–4.9)
Alkaline Phosphatase: 86 IU/L (ref 44–121)
BUN/Creatinine Ratio: 17 (ref 12–28)
BUN: 12 mg/dL (ref 8–27)
Bilirubin Total: 0.6 mg/dL (ref 0.0–1.2)
CO2: 26 mmol/L (ref 20–29)
Calcium: 9.5 mg/dL (ref 8.7–10.3)
Chloride: 102 mmol/L (ref 96–106)
Creatinine, Ser: 0.71 mg/dL (ref 0.57–1.00)
Globulin, Total: 2.8 g/dL (ref 1.5–4.5)
Glucose: 93 mg/dL (ref 70–99)
Potassium: 3.8 mmol/L (ref 3.5–5.2)
Sodium: 138 mmol/L (ref 134–144)
Total Protein: 7.1 g/dL (ref 6.0–8.5)
eGFR: 97 mL/min/{1.73_m2} (ref >59)

## 2023-08-13 LAB — CBC WITH DIFFERENTIAL/PLATELET
Basophils Absolute: 0 10*3/uL (ref 0.0–0.2)
Basos: 1 %
EOS (ABSOLUTE): 0.1 10*3/uL (ref 0.0–0.4)
Eos: 1 %
Hematocrit: 36.7 % (ref 34.0–46.6)
Hemoglobin: 12.1 g/dL (ref 11.1–15.9)
Immature Grans (Abs): 0 10*3/uL (ref 0.0–0.1)
Immature Granulocytes: 0 %
Lymphocytes Absolute: 2.2 10*3/uL (ref 0.7–3.1)
Lymphs: 49 %
MCH: 30.5 pg (ref 26.6–33.0)
MCHC: 33 g/dL (ref 31.5–35.7)
MCV: 92 fL (ref 79–97)
Monocytes Absolute: 0.3 10*3/uL (ref 0.1–0.9)
Monocytes: 7 %
Neutrophils Absolute: 1.9 10*3/uL (ref 1.4–7.0)
Neutrophils: 42 %
Platelets: 203 10*3/uL (ref 150–450)
RBC: 3.97 x10E6/uL (ref 3.77–5.28)
RDW: 12.3 % (ref 11.7–15.4)
WBC: 4.6 10*3/uL (ref 3.4–10.8)

## 2023-08-13 LAB — ARTHRITIS PANEL
Anti Nuclear Antibody (ANA): NEGATIVE
Rheumatoid fact SerPl-aCnc: <10 [IU]/mL (ref <14.0)
Sed Rate: 31 mm/h (ref 0–40)
Uric Acid: 3.8 mg/dL (ref 3.0–7.2)

## 2023-08-13 LAB — FERRITIN: Ferritin: 121 ng/mL (ref 15–150)

## 2023-08-13 LAB — VITAMIN D 25 HYDROXY (VIT D DEFICIENCY, FRACTURES): Vit D, 25-Hydroxy: 29.9 ng/mL — ABNORMAL LOW (ref 30.0–100.0)

## 2023-08-23 ENCOUNTER — Ambulatory Visit: Admitting: Internal Medicine

## 2023-10-05 ENCOUNTER — Encounter (INDEPENDENT_AMBULATORY_CARE_PROVIDER_SITE_OTHER): Payer: Self-pay | Admitting: Nurse Practitioner

## 2023-10-17 ENCOUNTER — Ambulatory Visit (INDEPENDENT_AMBULATORY_CARE_PROVIDER_SITE_OTHER): Payer: Self-pay

## 2023-10-17 DIAGNOSIS — M79604 Pain in right leg: Secondary | ICD-10-CM

## 2023-10-19 ENCOUNTER — Ambulatory Visit: Payer: Self-pay | Admitting: Family

## 2023-10-21 NOTE — Progress Notes (Signed)
 Patient notified

## 2023-10-25 ENCOUNTER — Encounter (INDEPENDENT_AMBULATORY_CARE_PROVIDER_SITE_OTHER): Payer: Self-pay

## 2023-11-05 ENCOUNTER — Other Ambulatory Visit: Payer: Self-pay | Admitting: Internal Medicine

## 2023-11-05 DIAGNOSIS — I10 Essential (primary) hypertension: Secondary | ICD-10-CM

## 2024-02-16 ENCOUNTER — Ambulatory Visit: Admitting: Internal Medicine

## 2024-04-11 ENCOUNTER — Other Ambulatory Visit: Payer: Self-pay | Admitting: Orthopedic Surgery

## 2024-04-11 DIAGNOSIS — M1711 Unilateral primary osteoarthritis, right knee: Secondary | ICD-10-CM

## 2024-04-11 DIAGNOSIS — S76109A Unspecified injury of unspecified quadriceps muscle, fascia and tendon, initial encounter: Secondary | ICD-10-CM

## 2024-05-08 ENCOUNTER — Other Ambulatory Visit: Payer: Self-pay | Admitting: Internal Medicine

## 2024-05-08 DIAGNOSIS — I10 Essential (primary) hypertension: Secondary | ICD-10-CM

## 2024-06-20 ENCOUNTER — Encounter: Payer: Self-pay | Admitting: Cardiology

## 2024-06-20 ENCOUNTER — Ambulatory Visit: Admitting: Cardiology

## 2024-06-20 VITALS — BP 110/82 | HR 76 | Temp 98.4°F | Ht 62.0 in | Wt 169.2 lb

## 2024-06-20 DIAGNOSIS — J060 Acute laryngopharyngitis: Secondary | ICD-10-CM | POA: Diagnosis not present

## 2024-06-20 DIAGNOSIS — I10 Essential (primary) hypertension: Secondary | ICD-10-CM | POA: Diagnosis not present

## 2024-06-20 DIAGNOSIS — J301 Allergic rhinitis due to pollen: Secondary | ICD-10-CM | POA: Diagnosis not present

## 2024-06-20 MED ORDER — FLUTICASONE PROPIONATE 50 MCG/ACT NA SUSP: 2.0000 | Freq: Every day | NASAL | 1 refills | Status: AC

## 2024-06-20 NOTE — Progress Notes (Signed)
 "  Established Patient Office Visit  Subjective:  Patient ID: JOYCIE Raymond, female    DOB: 05-Mar-1961  Age: 64 y.o. MRN: [id number]  Chief Complaint  Patient presents with   Cough   Headache    Patient in office for an acute visit. Complaining of cough and sore thorat. Started three days ago. Patient denies fever, PND, ear pain. Patient did  not use any OTC medications. Recommend nasal spray, antihistamine. Drink plenty of water.  Blood pressure well controlled.  Return for fasting lab work.  Continue current medications.   Cough This is a new problem. The current episode started in the past 7 days. The problem has been unchanged. The cough is Non-productive. Associated symptoms include nasal congestion, rhinorrhea and a sore throat. Pertinent negatives include no chest pain, ear congestion, ear pain, headaches, myalgias, postnasal drip or shortness of breath. She has tried nothing for the symptoms. The treatment provided no relief.    No other concerns at this time.   Past Medical History:  Diagnosis Date   Allergic rhinitis    Hypertension    Impaired glucose tolerance    Vitamin D  deficiency     Past Surgical History:  Procedure Laterality Date   TOE SURGERY      Social History   Socioeconomic History   Marital status: Married    Spouse name: Not on file   Number of children: Not on file   Years of education: Not on file   Highest education level: Not on file  Occupational History   Not on file  Tobacco Use   Smoking status: Never   Smokeless tobacco: Never  Vaping Use   Vaping status: Never Used  Substance and Sexual Activity   Alcohol use: No   Drug use: Not on file   Sexual activity: Not on file  Other Topics Concern   Not on file  Social History Narrative   Not on file   Social Drivers of Health   Tobacco Use: Low Risk (06/20/2024)   Patient History    Smoking Tobacco Use: Never    Smokeless Tobacco Use: Never    Passive Exposure: Not on  file  Financial Resource Strain: Low Risk  (02/21/2024)   Received from [facility]   Overall Financial Resource Strain (CARDIA)    Difficulty of Paying Living Expenses: Not hard at all  Food Insecurity: No Food Insecurity (02/21/2024)   Received from [facility]   Epic    Within the past 12 months, you worried that your food would run out before you got the money to buy more.: Never true    Within the past 12 months, the food you bought just didn't last and you didn't have money to get more.: Never true  Transportation Needs: No Transportation Needs (02/21/2024)   Received from [facility] - [facility]    In the past 12 months, has lack of transportation kept you from medical appointments or from getting medications?: No    Lack of Transportation (Non-Medical): No  Physical Activity: Not on file  Stress: Not on file  Social Connections: Unknown (10/20/2021)   Received from [facility]   Social Network    Social Network: Not on file  Intimate Partner Violence: Unknown (09/10/2021)   Received from [facility]   HITS    Physically Hurt: Not on file    Insult or Talk Down To: Not on file    Threaten  Physical Harm: Not on file    Scream or Curse: Not on file  Depression (PHQ2-9): Low Risk (08/15/2023)   Depression (PHQ2-9)    PHQ-2 Score: 4  Alcohol Screen: Not on file  Housing: Low Risk  (02/21/2024)   Received from [facility]   Epic    In the last 12 months, was there a time when you were not able to pay the mortgage or rent on time?: No    In the past 12 months, how many times have you moved where you were living?: 0    At any time in the past 12 months, were you homeless or living in a shelter (including now)?: No  Utilities: Not At Risk (02/21/2024)   Received from [facility]   Epic    In the past 12 months has the electric, gas, oil, or water company threatened to shut off  services in your home?: No  Health Literacy: Not on file    Family History  Problem Relation Age of Onset   Breast cancer Cousin 74       maternal    Allergies[1]  Show/hide medication list[2]  Review of Systems  Constitutional: Negative.   HENT:  Positive for rhinorrhea and sore throat. Negative for ear pain and postnasal drip.   Eyes: Negative.   Respiratory:  Positive for cough. Negative for shortness of breath.   Cardiovascular: Negative.  Negative for chest pain.  Gastrointestinal: Negative.  Negative for abdominal pain, constipation and diarrhea.  Genitourinary: Negative.   Musculoskeletal:  Negative for joint pain and myalgias.  Skin: Negative.   Neurological:  Negative for dizziness and headaches.  Endo/Heme/Allergies: Negative.   All other systems reviewed and are negative.      Objective:   BP 110/82   Pulse 76   Temp 98.4 F (36.9 C) (Tympanic)   Ht 5' 2 (1.575 m)   Wt 169 lb 3.2 oz (76.7 kg)   SpO2 95%   BMI 30.95 kg/m   Vitals:   06/20/24 1128  BP: 110/82  Pulse: 76  Temp: 98.4 F (36.9 C)  Height: 5' 2 (1.575 m)  Weight: 169 lb 3.2 oz (76.7 kg)  SpO2: 95%  TempSrc: Tympanic  BMI (Calculated): 30.94    Physical Exam Vitals and nursing note reviewed.  Constitutional:      Appearance: Normal appearance. She is normal weight.  HENT:     Head: Normocephalic and atraumatic.     Nose: Nose normal.     Mouth/Throat:     Mouth: Mucous membranes are moist.  Eyes:     Extraocular Movements: Extraocular movements intact.     Conjunctiva/sclera: Conjunctivae normal.     Pupils: Pupils are equal, round, and reactive to light.  Cardiovascular:     Rate and Rhythm: Normal rate and regular rhythm.     Pulses: Normal pulses.     Heart sounds: Normal heart sounds.  Pulmonary:     Effort: Pulmonary effort is normal.     Breath sounds: Normal breath sounds.  Abdominal:     General: Abdomen is flat. Bowel sounds are normal.     Palpations:  Abdomen is soft.  Musculoskeletal:        General: Normal range of motion.     Cervical back: Normal range of motion.  Skin:    General: Skin is warm and dry.  Neurological:     General: No focal deficit present.     Mental Status: She  is alert and oriented to person, place, and time.  Psychiatric:        Mood and Affect: Mood normal.        Behavior: Behavior normal.        Thought Content: Thought content normal.        Judgment: Judgment normal.      No results found for any visits on 06/20/24.  No results found for this or any previous visit (from the past 2160 hours).    Assessment & Plan:  Flonase  Antihistamine Drink plenty of water Return for fasting lab work  Problem List Items Addressed This Visit       Cardiovascular and Mediastinum   Essential hypertension, benign     Respiratory   Acute laryngopharyngitis   Seasonal allergic rhinitis due to pollen - Primary   Relevant Medications   fluticasone  (FLONASE ) 50 MCG/ACT nasal spray    Return in about 4 weeks (around 07/18/2024) for with NK, fasting lab work prior.   Total time spent: 25 minutes. This time includes review of previous notes and results and patient face to face interaction during today's visit.    Jeoffrey Pollen, NP  06/20/2024   This document may have been prepared by [facility] Voice Recognition software and as such may include unintentional dictation errors.     [1]  Allergies Allergen Reactions   Hydrocodone    Penicillins Hives  [2]  Outpatient Medications Prior to Visit  Medication Sig   amLODipine-olmesartan (AZOR) 5-20 MG tablet TAKE 1 TABLET BY MOUTH EVERY DAY   [DISCONTINUED] fluticasone  (FLONASE ) 50 MCG/ACT nasal spray SPRAY 1 SPRAY INTO BOTH NOSTRILS DAILY.   [DISCONTINUED] doxycycline  (VIBRA -TABS) 100 MG tablet Take 1 tablet (100 mg total) by mouth 2 (two) times daily. (Patient not taking: Reported on 06/20/2024)   [DISCONTINUED] gabapentin  (NEURONTIN ) 100 MG capsule Take 1  capsule (100 mg total) by mouth daily. (Patient not taking: Reported on 06/20/2024)   [DISCONTINUED] Vitamin D , Ergocalciferol , (DRISDOL) 1.25 MG (50000 UNIT) CAPS capsule Take 50,000 Units by mouth once a week. (Patient not taking: Reported on 06/20/2024)   No facility-administered medications prior to visit.   "

## 2024-07-19 ENCOUNTER — Ambulatory Visit: Admitting: Internal Medicine
